# Patient Record
Sex: Male | Born: 1970 | Hispanic: No | Marital: Married | Smoking: Former smoker
Health system: Southern US, Community
[De-identification: ages and names within clinical notes are randomized; demographics above are authoritative.]

## PROBLEM LIST (undated history)

## (undated) DIAGNOSIS — E669 Obesity, unspecified: Secondary | ICD-10-CM

## (undated) DIAGNOSIS — I517 Cardiomegaly: Secondary | ICD-10-CM

## (undated) DIAGNOSIS — I10 Essential (primary) hypertension: Secondary | ICD-10-CM

## (undated) HISTORY — PX: BACK SURGERY: SHX140

## (undated) HISTORY — DX: Cardiomegaly: I51.7

## (undated) HISTORY — DX: Essential (primary) hypertension: I10

## (undated) HISTORY — PX: SHOULDER SURGERY: SHX246

---

## 2012-03-08 DIAGNOSIS — M5137 Other intervertebral disc degeneration, lumbosacral region: Secondary | ICD-10-CM | POA: Diagnosis not present

## 2012-03-08 DIAGNOSIS — Z0000 Encounter for general adult medical examination without abnormal findings: Secondary | ICD-10-CM | POA: Diagnosis not present

## 2012-03-08 DIAGNOSIS — I10 Essential (primary) hypertension: Secondary | ICD-10-CM | POA: Diagnosis not present

## 2012-03-08 DIAGNOSIS — G4730 Sleep apnea, unspecified: Secondary | ICD-10-CM | POA: Diagnosis not present

## 2012-03-09 DIAGNOSIS — I10 Essential (primary) hypertension: Secondary | ICD-10-CM | POA: Diagnosis not present

## 2012-03-09 DIAGNOSIS — E782 Mixed hyperlipidemia: Secondary | ICD-10-CM | POA: Diagnosis not present

## 2012-04-30 DIAGNOSIS — M5137 Other intervertebral disc degeneration, lumbosacral region: Secondary | ICD-10-CM | POA: Diagnosis not present

## 2012-04-30 DIAGNOSIS — I10 Essential (primary) hypertension: Secondary | ICD-10-CM | POA: Diagnosis not present

## 2013-11-16 ENCOUNTER — Encounter (HOSPITAL_COMMUNITY): Payer: Self-pay | Admitting: Emergency Medicine

## 2013-11-16 ENCOUNTER — Emergency Department (HOSPITAL_COMMUNITY)
Admission: EM | Admit: 2013-11-16 | Discharge: 2013-11-17 | Disposition: A | Discharge status: Home / Self Care | Payer: Medicare Other | Attending: Emergency Medicine | Admitting: Emergency Medicine

## 2013-11-16 ENCOUNTER — Emergency Department (HOSPITAL_COMMUNITY): Payer: Medicare Other

## 2013-11-16 DIAGNOSIS — R61 Generalized hyperhidrosis: Secondary | ICD-10-CM | POA: Diagnosis not present

## 2013-11-16 DIAGNOSIS — E669 Obesity, unspecified: Secondary | ICD-10-CM | POA: Diagnosis not present

## 2013-11-16 DIAGNOSIS — Z87891 Personal history of nicotine dependence: Secondary | ICD-10-CM | POA: Insufficient documentation

## 2013-11-16 DIAGNOSIS — R072 Precordial pain: Secondary | ICD-10-CM | POA: Diagnosis not present

## 2013-11-16 DIAGNOSIS — R0602 Shortness of breath: Secondary | ICD-10-CM | POA: Diagnosis not present

## 2013-11-16 DIAGNOSIS — I10 Essential (primary) hypertension: Secondary | ICD-10-CM | POA: Insufficient documentation

## 2013-11-16 DIAGNOSIS — I509 Heart failure, unspecified: Secondary | ICD-10-CM | POA: Diagnosis not present

## 2013-11-16 DIAGNOSIS — R079 Chest pain, unspecified: Secondary | ICD-10-CM

## 2013-11-16 DIAGNOSIS — Z79899 Other long term (current) drug therapy: Secondary | ICD-10-CM | POA: Insufficient documentation

## 2013-11-16 DIAGNOSIS — R0789 Other chest pain: Secondary | ICD-10-CM | POA: Insufficient documentation

## 2013-11-16 HISTORY — DX: Obesity, unspecified: E66.9

## 2013-11-16 LAB — BASIC METABOLIC PANEL
Anion gap: 13 (ref 5–15)
BUN: 9 mg/dL (ref 6–23)
CALCIUM: 8.7 mg/dL (ref 8.4–10.5)
CO2: 24 meq/L (ref 19–32)
CREATININE: 1.06 mg/dL (ref 0.50–1.35)
Chloride: 102 mEq/L (ref 96–112)
GFR calc Af Amer: >90 mL/min (ref >90)
GFR calc non Af Amer: 86 mL/min — ABNORMAL LOW (ref >90)
GLUCOSE: 111 mg/dL — AB (ref 70–99)
Potassium: 3.5 mEq/L — ABNORMAL LOW (ref 3.7–5.3)
Sodium: 139 mEq/L (ref 137–147)

## 2013-11-16 LAB — CBC WITH DIFFERENTIAL/PLATELET
Basophils Absolute: 0 10*3/uL (ref 0.0–0.1)
Basophils Relative: 0 % (ref 0–1)
Eosinophils Absolute: 0.1 10*3/uL (ref 0.0–0.7)
Eosinophils Relative: 1 % (ref 0–5)
HEMATOCRIT: 41.6 % (ref 39.0–52.0)
Hemoglobin: 13.9 g/dL (ref 13.0–17.0)
LYMPHS ABS: 3 10*3/uL (ref 0.7–4.0)
Lymphocytes Relative: 35 % (ref 12–46)
MCH: 29.6 pg (ref 26.0–34.0)
MCHC: 33.4 g/dL (ref 30.0–36.0)
MCV: 88.7 fL (ref 78.0–100.0)
MONO ABS: 0.5 10*3/uL (ref 0.1–1.0)
MONOS PCT: 6 % (ref 3–12)
Neutro Abs: 5 10*3/uL (ref 1.7–7.7)
Neutrophils Relative %: 58 % (ref 43–77)
Platelets: 199 10*3/uL (ref 150–400)
RBC: 4.69 MIL/uL (ref 4.22–5.81)
RDW: 15.5 % (ref 11.5–15.5)
WBC: 8.6 10*3/uL (ref 4.0–10.5)

## 2013-11-16 LAB — PRO B NATRIURETIC PEPTIDE: Pro B Natriuretic peptide (BNP): 36.7 pg/mL (ref 0–125)

## 2013-11-16 LAB — I-STAT TROPONIN, ED: Troponin i, poc: 0.01 ng/mL (ref 0.00–0.08)

## 2013-11-16 LAB — TROPONIN I

## 2013-11-16 MED ORDER — KETOROLAC TROMETHAMINE 30 MG/ML IJ SOLN
30.0000 mg | Freq: Once | INTRAMUSCULAR | Status: AC
  Administered 2013-11-16: 30 mg via INTRAVENOUS
  Filled 2013-11-16: qty 1

## 2013-11-16 MED ORDER — MORPHINE SULFATE 4 MG/ML IJ SOLN
4.0000 mg | Freq: Once | INTRAMUSCULAR | Status: AC
  Administered 2013-11-16: 4 mg via INTRAVENOUS
  Filled 2013-11-16: qty 1

## 2013-11-16 MED ORDER — HYDROCODONE-ACETAMINOPHEN 5-325 MG PO TABS: 1.0000 | ORAL_TABLET | ORAL | Status: DC | PRN

## 2013-11-16 NOTE — ED Provider Notes (Signed)
CSN: [id number]     Arrival date & time 11/16/13  2133 History  This chart was scribed for Geoffery Lyonsouglas Landen Knoedler, MD by Modena JanskyAlbert Thayil, ED Scribe. This patient was seen in room APA05/APA05 and the patient's care was started at 10:19 PM.   Chief Complaint  Patient presents with  . Chest Pain   Patient is a 43 y.o. male presenting with chest pain. The history is provided by the patient and the spouse. No language interpreter was used.  Chest Pain Pain location:  Substernal area Pain quality: sharp   Pain radiates to:  Does not radiate Pain radiates to the back: no   Pain severity:  Moderate Onset quality:  Sudden Duration:  1 day Timing:  Constant Progression:  Unchanged Chronicity:  New Context: at rest   Relieved by:  None tried Worsened by:  Deep breathing Ineffective treatments:  None tried Associated symptoms: diaphoresis and shortness of breath   Associated symptoms: no nausea   Risk factors: no smoking    HPI Comments: Dustin Burke is a 43 y.o. male with a hx of heart palpitations and HTN who presents to the Emergency Department complaining of constant moderate substernal chest pain that started today. He reports that when the he had SOB, diaphoresis, and his forearm would start tingling when the chest pain started. He describes the pain as a sharp sensation. He states that breathing and palpation exacerbates the pain. He reports that before today the pain has been intermittent. He reports that he is a former smoker. He denies any nausea.   Past Medical History  Diagnosis Date  . CHF (congestive heart failure)   . Obesity   . Hypertension    Past Surgical History  Procedure Laterality Date  . Back surgery    . Shoulder surgery Right    History reviewed. No pertinent family history. History  Substance Use Topics  . Smoking status: Former Smoker    Types: Cigarettes  . Smokeless tobacco: Not on file  . Alcohol Use: Yes     Comment: rare    Review of Systems  Constitutional:  Positive for diaphoresis.  Respiratory: Positive for shortness of breath.   Cardiovascular: Positive for chest pain.  Gastrointestinal: Negative for nausea.  All other systems reviewed and are negative.   Allergies  Review of patient's allergies indicates no known allergies.  Home Medications   Prior to Admission medications   Medication Sig Start Date End Date Taking? Authorizing Provider  amLODipine (NORVASC) 5 MG tablet Take 5 mg by mouth daily.   Yes Historical Provider, MD  lisinopril (PRINIVIL,ZESTRIL) 20 MG tablet Take 20 mg by mouth daily.   Yes Historical Provider, MD  naproxen sodium (ALEVE) 220 MG tablet Take 220 mg by mouth daily as needed (for pain).   Yes Historical Provider, MD   BP 164/106  Pulse 73  Temp(Src) 98.2 F (36.8 C) (Oral)  Resp 18  Ht 6\' 3"  (1.905 m)  Wt 272 lb (123.378 kg)  BMI 34.00 kg/m2  SpO2 99% Physical Exam  Nursing note and vitals reviewed. Constitutional: He is oriented to person, place, and time. He appears well-developed and well-nourished.  HENT:  Head: Normocephalic and atraumatic.  Neck: Neck supple. No tracheal deviation present.  Cardiovascular: Normal rate.   Pulmonary/Chest: Effort normal. No respiratory distress. He exhibits tenderness.  TTP of the left anterior chest wall. This reproduces symptoms.  Musculoskeletal: Normal range of motion.  Neurological: He is alert and oriented to person, place, and time.  Skin: Skin is warm and dry. He is not diaphoretic.  Psychiatric: He has a normal mood and affect. His behavior is normal.    ED Course  Procedures (including critical care time) DIAGNOSTIC STUDIES: Oxygen Saturation is 99% on RA, normal by my interpretation.    COORDINATION OF CARE: 10:23 PM- Pt advised of plan for treatment which includes medication, radiology, and labs and pt agrees.  Labs Review Labs Reviewed  BASIC METABOLIC PANEL - Abnormal; Notable for the following:    Potassium 3.5 (*)    Glucose, Bld 111  (*)    GFR calc non Af Amer 86 (*)    All other components within normal limits  CBC WITH DIFFERENTIAL  TROPONIN I  PRO B NATRIURETIC PEPTIDE  I-STAT TROPOININ, ED    Imaging Review No results found.   Date: 11/16/2013  Rate: 79  Rhythm: normal sinus rhythm  QRS Axis: normal  Intervals: normal  ST/T Wave abnormalities: normal  Conduction Disutrbances:none  Narrative Interpretation:   Old EKG Reviewed: none available    MDM   Final diagnoses:  None    Patient with no prior cardiac history but risk factors of hypertension and obesity. He presents with complaints of sharp pain to the left anterior chest wall that started earlier this afternoon. Is reproducible with palpation and deep breathing, but is associated with nausea and shortness of breath.  Thus far, a cardiac workup is negative. His EKG is normal and initial troponin is negative. He was given Toradol with some relief followed by a dose of morphine. He will remain in the ER for a second troponin. If this is negative I feel as though he is appropriate for discharge with outpatient followup. The nature of these symptoms appear musculoskeletal in nature and I doubt a cardiac etiology. As he does have the above risk factors, I feel as though followup with cardiology to discuss a stress test would be appropriate. The contact information for [facility] in [place] will be provided for him to arrange followup.  I personally performed the services described in this documentation, which was scribed in my presence. The recorded information has been reviewed and is accurate.       Geoffery Lyonsouglas Baylen Dea, MD 11/16/13 [phone]

## 2013-11-16 NOTE — ED Notes (Signed)
Patient had L sided sharp chest pain that went into a tightness last night at work.  Eventually went away.  Returned today at noon, resolving, then again upon returning home this evening.  Associated L lower arm tingling, some lightheadedness and SOB.  Had similar episode in 2011 and was dx w/CHF.

## 2013-11-16 NOTE — Discharge Instructions (Signed)
Ibuprofen 600 mg 3 times daily for the next 3 days. Hydrocodone as prescribed as needed for pain not relieved with ibuprofen.  Followup with [facility]ology on Monday to arrange a followup appointment to discuss a possible stress test. The contact information has been provided in this discharge summary.  Return to the emergency department if your symptoms substantially worsen or change.   Chest Pain (Nonspecific) It is often hard to give a specific diagnosis for the cause of chest pain. There is always a chance that your pain could be related to something serious, such as a heart attack or a blood clot in the lungs. You need to follow up with your health care provider for further evaluation. CAUSES   Heartburn.  Pneumonia or bronchitis.  Anxiety or stress.  Inflammation around your heart (pericarditis) or lung (pleuritis or pleurisy).  A blood clot in the lung.  A collapsed lung (pneumothorax). It can develop suddenly on its own (spontaneous pneumothorax) or from trauma to the chest.  Shingles infection (herpes zoster virus). The chest wall is composed of bones, muscles, and cartilage. Any of these can be the source of the pain.  The bones can be bruised by injury.  The muscles or cartilage can be strained by coughing or overwork.  The cartilage can be affected by inflammation and become sore (costochondritis). DIAGNOSIS  Lab tests or other studies may be needed to find the cause of your pain. Your health care provider may have you take a test called an ambulatory electrocardiogram (ECG). An ECG records your heartbeat patterns over a 24-hour period. You may also have other tests, such as:  Transthoracic echocardiogram (TTE). During echocardiography, sound waves are used to evaluate how blood flows through your heart.  Transesophageal echocardiogram (TEE).  Cardiac monitoring. This allows your health care provider to monitor your heart rate and rhythm in real time.  Holter  monitor. This is a portable device that records your heartbeat and can help diagnose heart arrhythmias. It allows your health care provider to track your heart activity for several days, if needed.  Stress tests by exercise or by giving medicine that makes the heart beat faster. TREATMENT   Treatment depends on what may be causing your chest pain. Treatment may include:  Acid blockers for heartburn.  Anti-inflammatory medicine.  Pain medicine for inflammatory conditions.  Antibiotics if an infection is present.  You may be advised to change lifestyle habits. This includes stopping smoking and avoiding alcohol, caffeine, and chocolate.  You may be advised to keep your head raised (elevated) when sleeping. This reduces the chance of acid going backward from your stomach into your esophagus. Most of the time, nonspecific chest pain will improve within 2-3 days with rest and mild pain medicine.  HOME CARE INSTRUCTIONS   If antibiotics were prescribed, take them as directed. Finish them even if you start to feel better.  For the next few days, avoid physical activities that bring on chest pain. Continue physical activities as directed.  Do not use any tobacco products, including cigarettes, chewing tobacco, or electronic cigarettes.  Avoid drinking alcohol.  Only take medicine as directed by your health care provider.  Follow your health care provider's suggestions for further testing if your chest pain does not go away.  Keep any follow-up appointments you made. If you do not go to an appointment, you could develop lasting (chronic) problems with pain. If there is any problem keeping an appointment, call to reschedule. SEEK MEDICAL CARE IF:  Your chest pain does not go away, even after treatment.  You have a rash with blisters on your chest.  You have a fever. SEEK IMMEDIATE MEDICAL CARE IF:   You have increased chest pain or pain that spreads to your arm, neck, jaw, back, or  abdomen.  You have shortness of breath.  You have an increasing cough, or you cough up blood.  You have severe back or abdominal pain.  You feel nauseous or vomit.  You have severe weakness.  You faint.  You have chills. This is an emergency. Do not wait to see if the pain will go away. Get medical help at once. Call your local emergency services (911 in [place] Do not drive yourself to the hospital. MAKE SURE YOU:   Understand these instructions.  Will watch your condition.  Will get help right away if you are not doing well or get worse. Document Released: 11/03/2004 Document Revised: 01/29/2013 Document Reviewed: 08/30/2007 [facility] Patient Information 2015 [employer] [place]. This information is not intended to replace advice given to you by your health care provider. Make sure you discuss any questions you have with your health care provider.

## 2013-11-17 LAB — TROPONIN I

## 2013-11-17 MED ORDER — LISINOPRIL 20 MG PO TABS
20.0000 mg | ORAL_TABLET | Freq: Every day | ORAL | Status: AC
Start: 2013-11-17 — End: ?

## 2013-11-17 MED ORDER — AMLODIPINE BESYLATE 5 MG PO TABS: 5.0000 mg | ORAL_TABLET | Freq: Every day | ORAL | Status: AC

## 2013-11-27 DIAGNOSIS — I10 Essential (primary) hypertension: Secondary | ICD-10-CM | POA: Diagnosis not present

## 2013-11-27 DIAGNOSIS — R079 Chest pain, unspecified: Secondary | ICD-10-CM | POA: Diagnosis not present

## 2013-12-03 ENCOUNTER — Encounter: Payer: Self-pay | Admitting: Cardiology

## 2013-12-03 ENCOUNTER — Ambulatory Visit (INDEPENDENT_AMBULATORY_CARE_PROVIDER_SITE_OTHER): Payer: Medicare Other | Admitting: Cardiology

## 2013-12-03 VITALS — BP 132/98 | HR 65 | Ht 75.0 in | Wt 299.0 lb

## 2013-12-03 DIAGNOSIS — I10 Essential (primary) hypertension: Secondary | ICD-10-CM

## 2013-12-03 DIAGNOSIS — R0789 Other chest pain: Secondary | ICD-10-CM

## 2013-12-03 NOTE — Progress Notes (Signed)
Reason for visit: Chest pain, history of hypertension and "enlarged heart"  Clinical Summary Dustin Burke is a 43 y.o.male referred for cardiology consultation by Dr. Reuel Boomaniel (he just established with [facility]). Recent ER visit noted at [facility] with atypical chest pain. Troponin I levels were negative for ACS and ECG showed sinus rhythm with no significant ST segment changes.  He is here with his wife today. Native of [place], moved from [place] to [place] recently, retired Agricultural consultantlong-distance truck driver. He states that he suddenly developed a sharp, focal, left-sided chest discomfort of moderate intensity at rest about a week before he went to the ER. There was no obvious precipitant for this. He states he had some relief with morphine, subsequently felt much better after using an anti-inflammatory prescribed at his visit with [facility]. No clear exertional exacerbation, no associated shortness of breath, felt mildly diaphoretic.  Chest x-ray from October 10 reports no active cardiopulmonary disease. Additional lab work found hemoglobin 13.9, platelets 199, potassium 3.5, BUN 9, creatinine 1.0, pro-BNP 36.7.  At baseline he has NYHA class II dyspnea, no palpitations or syncope. No claudication. Reports chronic back pain and prior back surgery.  No Known Allergies  Current Outpatient Prescriptions  Medication Sig Dispense Refill  . amLODipine (NORVASC) 5 MG tablet Take 1 tablet (5 mg total) by mouth daily.  15 tablet  0  . HYDROcodone-acetaminophen (NORCO) 5-325 MG per tablet Take 1-2 tablets by mouth every 4 (four) hours as needed.  12 tablet  0  . lisinopril (PRINIVIL) 20 MG tablet Take 1 tablet (20 mg total) by mouth daily.  15 tablet  0  . naproxen sodium (ALEVE) 220 MG tablet Take 220 mg by mouth daily as needed (for pain).       No current facility-administered medications for this visit.    Past Medical History  Diagnosis Date  . Obesity   . Essential hypertension   . Enlarged  heart     Details not clear     Past Surgical History  Procedure Laterality Date  . Back surgery    . Shoulder surgery Right     History reviewed. No pertinent family history.  Social History Dustin Burke reports that he has quit smoking. His smoking use included Cigarettes. He smoked 0.00 packs per day. He has never used smokeless tobacco. Dustin Burke reports that he drinks alcohol.  Review of Systems Complete review of systems negative except as otherwise outlined in the clinical summary and also the following. No fevers or chills. No cough or hemoptysis, stable appetite.  Physical Examination Filed Vitals:   12/03/13 0908  BP: 132/98  Pulse: 65   Filed Weights   12/03/13 0908  Weight: 299 lb (135.626 kg)   Obese male, appears comfortable at rest. HEENT: Conjunctiva and lids normal, oropharynx clear. Neck: Supple, no elevated JVP or carotid bruits, no thyromegaly. Lungs: Clear to auscultation, nonlabored breathing at rest. Cardiac: Regular rate and rhythm, no S3 or significant systolic murmur, no pericardial rub. Abdomen: Soft, nontender, bowel sounds present, no guarding or rebound. Extremities: No pitting edema, distal pulses 2+. Skin: Warm and dry. Musculoskeletal: No kyphosis. Neuropsychiatric: Alert and oriented x3, affect grossly appropriate.   Problem List and Plan   Atypical chest pain Symptoms atypical for ischemic etiology. 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 Baseline ECG normal. We will obtain a GXT for assessment.  Essential hypertension Reports compliance with Norvasc and lisinopril. States  that he was told that he had an enlarged heart following an echocardiogram done in [place] back in 2011, details not clear. Suspect that this meant he had LVH, although cannot exclude actual cardiomegaly or cardiomyopathy. We will obtain an echocardiogram as well to clarify.    Jonelle SidleSamuel G.  Emilyrose Darrah, M.D., F.A.C.C.

## 2013-12-03 NOTE — Assessment & Plan Note (Signed)
Symptoms atypical for ischemic etiology. 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 Baseline ECG normal. We will obtain a GXT for assessment.

## 2013-12-03 NOTE — Patient Instructions (Signed)
Your physician recommends that you schedule a follow-up appointment in: to be determined after tests.    Your physician recommends that you continue on your current medications as directed. Please refer to the Current Medication list given to you today.    Your physician has requested that you have an echocardiogram. Echocardiography is a painless test that uses sound waves to create images of your heart. It provides your doctor with information about the size and shape of your heart and how well your heart's chambers and valves are working. This procedure takes approximately one hour. There are no restrictions for this procedure.     Your physician has requested that you have an exercise tolerance test. For further information please visit [email or web address]. Please also follow instruction sheet, as given.        Thank you for choosing [facility] !         

## 2013-12-03 NOTE — Assessment & Plan Note (Signed)
Reports compliance with Norvasc and lisinopril. States that he was told that he had an enlarged heart following an echocardiogram done in [place] back in 2011, details not clear. Suspect that this meant he had LVH, although cannot exclude actual cardiomegaly or cardiomyopathy. We will obtain an echocardiogram as well to clarify.

## 2013-12-06 ENCOUNTER — Ambulatory Visit (HOSPITAL_COMMUNITY)
Admission: RE | Admit: 2013-12-06 | Discharge: 2013-12-06 | Disposition: A | Discharge status: Home / Self Care | Payer: Medicare Other | Source: Ambulatory Visit | Attending: Cardiology | Admitting: Cardiology

## 2013-12-06 ENCOUNTER — Encounter (HOSPITAL_COMMUNITY): Payer: Self-pay

## 2013-12-06 DIAGNOSIS — I10 Essential (primary) hypertension: Secondary | ICD-10-CM | POA: Insufficient documentation

## 2013-12-06 DIAGNOSIS — Z6837 Body mass index (BMI) 37.0-37.9, adult: Secondary | ICD-10-CM | POA: Diagnosis not present

## 2013-12-06 DIAGNOSIS — Z87891 Personal history of nicotine dependence: Secondary | ICD-10-CM | POA: Diagnosis not present

## 2013-12-06 DIAGNOSIS — E669 Obesity, unspecified: Secondary | ICD-10-CM | POA: Insufficient documentation

## 2013-12-06 DIAGNOSIS — I517 Cardiomegaly: Secondary | ICD-10-CM | POA: Insufficient documentation

## 2013-12-06 DIAGNOSIS — R079 Chest pain, unspecified: Secondary | ICD-10-CM | POA: Insufficient documentation

## 2013-12-06 DIAGNOSIS — I708 Atherosclerosis of other arteries: Secondary | ICD-10-CM | POA: Diagnosis not present

## 2013-12-06 DIAGNOSIS — I379 Nonrheumatic pulmonary valve disorder, unspecified: Secondary | ICD-10-CM | POA: Diagnosis not present

## 2013-12-06 DIAGNOSIS — R0789 Other chest pain: Secondary | ICD-10-CM

## 2013-12-06 NOTE — Progress Notes (Signed)
  Echocardiogram 2D Echocardiogram has been performed.  Marta Bouie 12/06/2013, 9:07 AM

## 2013-12-06 NOTE — Progress Notes (Addendum)
Stress Lab Nurses Notes - [facility]  Tresea MallJohn Summa 12/06/2013 Reason for doing test: Chest Pain and HTN Type of test: Regular GTX Nurse performing test: Parke PoissonPhyllis Billingsly, RN Nuclear Medicine Tech: Not Applicable Echo Tech: Not Applicable MD performing test: Shontia Gillooly/K.Lyman BishopLawrence NP Family MD: Sharion Balloonaniel Test explained and consent signed: Yes.   IV started: No IV started Symptoms: SOB & Fatigue Treatment/Intervention: None Reason test stopped: fatigue After recovery IV was: NA Patient to return to Nuc. Med at : NA Patient discharged: Home Patient's Condition upon discharge was: stable Comments: During test peak BP 192/81 & HR 157 .  Recovery BP 134/92 & HR 83.  Symptoms resolved in recovery. Erskine SpeedBillingsley, Phyllis T  Patient exercised according to the Bruce protocol for 9 min 14 sec, achieving 10.80 METs. Resting HR increased from 58 bpm up to 157 bpm representing 87% of THR, and resting bp increased from 137/98 up to 192/81. The test was stopped due to fatigue, the patient did not experience any chest pain.  Baseline EKG shows sinus bradycardia. Stress EKG shows non-specific upsloping ST depressions in the inferior leads and no significant arrhythmias  Conclusions. 1. Negative Stress EKG for ischemia 2. Duke treadmill score of 9, consistent with low risk for major cardiac events 3. Average exercise tolerance (100% of predicted based on age and gender)   Dominga FerryJ Ajene Carchi MD

## 2013-12-09 ENCOUNTER — Telehealth: Payer: Self-pay

## 2013-12-09 NOTE — Telephone Encounter (Signed)
I reviewed the GXT report. Please let patient know that it was normal, overall low risk study from a cardiac perspective.       ----- Message -----    From: Antoine PocheJonathan F Branch, MD    Sent: 12/06/2013  4:49 PM     To: Jonelle SidleSamuel G McDowell, MD   Subject: Inpatient Notes                        Spoke with pt,results given,copy to pcp

## 2014-05-19 DIAGNOSIS — I10 Essential (primary) hypertension: Secondary | ICD-10-CM | POA: Diagnosis not present

## 2014-11-22 DIAGNOSIS — M5442 Lumbago with sciatica, left side: Secondary | ICD-10-CM | POA: Diagnosis not present

## 2014-11-22 DIAGNOSIS — L2089 Other atopic dermatitis: Secondary | ICD-10-CM | POA: Diagnosis not present

## 2014-11-22 DIAGNOSIS — I10 Essential (primary) hypertension: Secondary | ICD-10-CM | POA: Diagnosis not present

## 2016-01-04 ENCOUNTER — Emergency Department (HOSPITAL_COMMUNITY)
Admission: EM | Admit: 2016-01-04 | Discharge: 2016-01-04 | Disposition: A | Discharge status: Home / Self Care | Payer: Commercial Managed Care - HMO | Attending: Emergency Medicine | Admitting: Emergency Medicine

## 2016-01-04 ENCOUNTER — Emergency Department (HOSPITAL_COMMUNITY): Payer: Commercial Managed Care - HMO

## 2016-01-04 ENCOUNTER — Encounter (HOSPITAL_COMMUNITY): Payer: Self-pay | Admitting: Emergency Medicine

## 2016-01-04 DIAGNOSIS — R079 Chest pain, unspecified: Secondary | ICD-10-CM | POA: Diagnosis not present

## 2016-01-04 DIAGNOSIS — Z7982 Long term (current) use of aspirin: Secondary | ICD-10-CM | POA: Diagnosis not present

## 2016-01-04 DIAGNOSIS — I10 Essential (primary) hypertension: Secondary | ICD-10-CM | POA: Insufficient documentation

## 2016-01-04 DIAGNOSIS — M549 Dorsalgia, unspecified: Secondary | ICD-10-CM | POA: Insufficient documentation

## 2016-01-04 DIAGNOSIS — Z79899 Other long term (current) drug therapy: Secondary | ICD-10-CM | POA: Insufficient documentation

## 2016-01-04 DIAGNOSIS — R0602 Shortness of breath: Secondary | ICD-10-CM | POA: Diagnosis not present

## 2016-01-04 DIAGNOSIS — R61 Generalized hyperhidrosis: Secondary | ICD-10-CM | POA: Diagnosis not present

## 2016-01-04 DIAGNOSIS — Z87891 Personal history of nicotine dependence: Secondary | ICD-10-CM | POA: Diagnosis not present

## 2016-01-04 LAB — BASIC METABOLIC PANEL
ANION GAP: 4 — AB (ref 5–15)
BUN: 16 mg/dL (ref 6–20)
CHLORIDE: 109 mmol/L (ref 101–111)
CO2: 25 mmol/L (ref 22–32)
Calcium: 8.5 mg/dL — ABNORMAL LOW (ref 8.9–10.3)
Creatinine, Ser: 0.93 mg/dL (ref 0.61–1.24)
GFR calc Af Amer: >60 mL/min (ref >60)
GLUCOSE: 121 mg/dL — AB (ref 65–99)
POTASSIUM: 3.7 mmol/L (ref 3.5–5.1)
Sodium: 138 mmol/L (ref 135–145)

## 2016-01-04 LAB — CBC
HEMATOCRIT: 42.1 % (ref 39.0–52.0)
HEMOGLOBIN: 14.2 g/dL (ref 13.0–17.0)
MCH: 30.7 pg (ref 26.0–34.0)
MCHC: 33.7 g/dL (ref 30.0–36.0)
MCV: 91.1 fL (ref 78.0–100.0)
Platelets: 154 10*3/uL (ref 150–400)
RBC: 4.62 MIL/uL (ref 4.22–5.81)
RDW: 14.5 % (ref 11.5–15.5)
WBC: 7.5 10*3/uL (ref 4.0–10.5)

## 2016-01-04 LAB — I-STAT TROPONIN, ED
TROPONIN I, POC: 0.01 ng/mL (ref 0.00–0.08)
Troponin i, poc: 0.01 ng/mL (ref 0.00–0.08)

## 2016-01-04 MED ORDER — FENTANYL CITRATE (PF) 100 MCG/2ML IJ SOLN
50.0000 ug | Freq: Once | INTRAMUSCULAR | Status: AC
  Administered 2016-01-04: 50 ug via INTRAVENOUS
  Filled 2016-01-04: qty 2

## 2016-01-04 MED ORDER — ASPIRIN 81 MG PO CHEW
324.0000 mg | CHEWABLE_TABLET | Freq: Once | ORAL | Status: AC
  Administered 2016-01-04: 324 mg via ORAL
  Filled 2016-01-04: qty 4

## 2016-01-04 MED ORDER — HYDROCODONE-ACETAMINOPHEN 5-325 MG PO TABS: 1.0000 | ORAL_TABLET | Freq: Four times a day (QID) | ORAL | 0 refills | Status: DC | PRN

## 2016-01-04 MED ORDER — ASPIRIN 81 MG PO CHEW: 81.0000 mg | CHEWABLE_TABLET | Freq: Every day | ORAL | 1 refills | Status: AC

## 2016-01-04 NOTE — ED Triage Notes (Signed)
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 Pt states the pain has been constant since it started. Pt denies n/v.

## 2016-01-04 NOTE — Discharge Instructions (Signed)
Workup for the chest pain without any acute findings. Start taking a baby aspirin a day. Take hydrocodone as needed for the pain. Work note provided. Make an appointment to follow-up with cardiology. Return for any new or worse symptoms at all.

## 2016-01-04 NOTE — ED Provider Notes (Signed)
[facility]Y DEPT Provider Note   CSN: [id number] Arrival date & time: 01/04/16  1254  By signing my name below, I, Dustin Burke, attest that this documentation has been prepared under the direction and in the presence of Vanetta MuldersScott Darrel Gloss, MD . Electronically Signed: Majel HomerPeyton Burke, Scribe. 01/04/2016. 1:26 PM.  History   Chief Complaint Chief Complaint  Patient presents with  . Chest Pain   The history is provided by the patient. No language interpreter was used.   HPI Comments: Dustin Burke is a 45 y.o. male with PMHx of HTN, who presents to the Emergency Department complaining of gradually worsening, 8/10, left sided chest pain radiating into his left arm that began 3 days ago. Pt reports his pain began as intermittent but has become constant since ~11:30 PM last night. He notes associated shortness of breath and diaphoresis. He states he took 81 mg Bayer at ~8:30 PM last night with no relief; he notes he has not taken any medication today. Pt denies nausea, vomiting, PSHx of stents or cardiac catheterization and hx of smoking. He denies FHx of MI or heart problems.   Past Medical History:  Diagnosis Date  . Enlarged heart    Details not clear   . Essential hypertension   . Obesity     Patient Active Problem List   Diagnosis Date Noted  . Atypical chest pain 12/03/2013  . Essential hypertension 12/03/2013    Past Surgical History:  Procedure Laterality Date  . BACK SURGERY    . SHOULDER SURGERY Right     Home Medications    Prior to Admission medications   Medication Sig Start Date End Date Taking? Authorizing Provider  amLODipine (NORVASC) 5 MG tablet Take 1 tablet (5 mg total) by mouth daily. 11/17/13   Derwood KaplanAnkit Nanavati, MD  HYDROcodone-acetaminophen (NORCO) 5-325 MG per tablet Take 1-2 tablets by mouth every 4 (four) hours as needed. 11/16/13   Geoffery Lyonsouglas Delo, MD  lisinopril (PRINIVIL) 20 MG tablet Take 1 tablet (20 mg total) by mouth daily. 11/17/13   Derwood KaplanAnkit Nanavati, MD    naproxen sodium (ALEVE) 220 MG tablet Take 220 mg by mouth daily as needed (for pain).    Historical Provider, MD    Family History Family History  Problem Relation Age of Onset  . Hypertension Mother   . Asthma Mother   . Diabetes Father   . Stroke Other     Social History Social History  Substance Use Topics  . Smoking status: Former Smoker    Types: Cigarettes  . Smokeless tobacco: Never Used  . Alcohol use Yes     Comment: Rare     Allergies   Patient has no known allergies.   Review of Systems Review of Systems  Constitutional: Positive for diaphoresis. Negative for chills and fever.  HENT: Negative for rhinorrhea and sore throat.   Eyes: Negative for visual disturbance.  Respiratory: Positive for shortness of breath. Negative for cough.   Cardiovascular: Positive for chest pain.  Gastrointestinal: Negative for abdominal pain, diarrhea, nausea and vomiting.  Genitourinary: Negative for dysuria and hematuria.  Musculoskeletal: Positive for back pain. Negative for joint swelling.  Skin: Negative for rash.  Neurological: Negative for headaches.  Hematological: Does not bruise/bleed easily.   Physical Exam Updated Vital Signs BP 152/88 (BP Location: Left Arm)   Pulse 61   Temp 98.5 F (36.9 C) (Oral)   Resp 16   Ht 6\' 3"  (1.905 m)   Wt 272 lb (123.4 kg)  SpO2 97%   BMI 34.00 kg/m   Physical Exam  Constitutional: He is oriented to person, place, and time.  HENT:  Head: Normocephalic and atraumatic.  Mouth/Throat: Oropharynx is clear and moist.  Moist mucous membranes   Eyes: EOM are normal. Pupils are equal, round, and reactive to light. No scleral icterus.  Eyes are tracking normally, sclera clear.   Cardiovascular: Normal rate, regular rhythm and normal heart sounds.   Pulmonary/Chest: Effort normal and breath sounds normal.  Lungs clear to auscultation bilaterally   Abdominal: Bowel sounds are normal. There is no tenderness.  Musculoskeletal: He  exhibits no edema, tenderness or deformity.  No swelling in ankles   Neurological: He is alert and oriented to person, place, and time. Coordination normal.  Skin: No rash noted. No erythema. No pallor.   ED Treatments / Results  Labs (all labs ordered are listed, but only abnormal results are displayed) Labs Reviewed - No data to display  EKG  EKG Interpretation  Date/Time:  Monday January 04 2016 12:58:52 EST Ventricular Rate:  63 PR Interval:  174 QRS Duration: 96 QT Interval:  374 QTC Calculation: 382 R Axis:   32 Text Interpretation:  Normal sinus rhythm Normal ECG Confirmed by Alta Goding  MD, Ondine Gemme ([id number]) on 01/04/2016 1:10:53 PM       Radiology No results found.  Procedures Procedures (including critical care time)  Medications Ordered in ED Medications - No data to display  DIAGNOSTIC STUDIES:  Oxygen Saturation is 98% on RA, normal by my interpretation.    COORDINATION OF CARE:  1:25 PM Discussed treatment plan with pt at bedside and pt agreed to plan.  Initial Impression / Assessment and Plan / ED Course  I have reviewed the triage vital signs and the nursing notes.  Pertinent labs & imaging results that were available during my care of the patient were reviewed by me and considered in my medical decision making (see chart for details).  Clinical Course     Extensive workup for the chest pain including troponins 2 negative chest x-ray negative no hypoxia. No exact cause for the chest pain that's been there for several days and has been constant since last night. Will treat with baby aspirin a day hydrocodone follow-up with cardiology no evidence of pneumonia no evidence of pneumothorax. Cardiac markers negative. EKG without acute findings.  I personally performed the services described in this documentation, which was scribed in my presence. The recorded information has been reviewed and is accurate.     Final Clinical Impressions(s) / ED Diagnoses     Final diagnoses:  None    New Prescriptions New Prescriptions   No medications on file     Vanetta MuldersScott Diyari Cherne, MD 01/04/16 1740

## 2016-01-12 ENCOUNTER — Encounter: Payer: Self-pay | Admitting: Cardiology

## 2016-01-19 ENCOUNTER — Encounter: Payer: Self-pay | Admitting: Cardiology

## 2016-01-19 NOTE — Progress Notes (Signed)
Cardiology Office Note  Date: 01/20/2016   ID: Dustin MallJohn Burke, DOB Nov 17, 1970, MRN [id number]  PCP: Donzetta SprungERRY DANIEL, MD  Evaluating Cardiologist: Nona DellSamuel Gustav Knueppel, MD   Chief Complaint  Patient presents with  . Precordial pain    History of Present Illness: Dustin Burke is a 45 y.o. male referred back to the office after recent ER visit in late November. He was seen back in 2015 for evaluation of atypical chest pain. Previous cardiac testing is outlined below. He was evaluated by Dr. Deretha EmoryZackowski during ER visit, ECG did not show any acute changes and troponin I levels were negative.  He is here with his wife today. 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 He states that he was beginning to experience chest discomfort, aching sensation, worsened by activity at times but not exclusively. The wife states that he had been having this fairly regularly, he reports a more intense episode that led to the ER visit. He has been back at home and not driving and reports no further symptoms. Question brought up by patient and wife whether he might have been experiencing stress being away from home as a cause for symptoms. He has not had any exertional symptoms now with typical ADLs.  Past Medical History:  Diagnosis Date  . Essential hypertension   . Left ventricular hypertrophy   . Obesity     Past Surgical History:  Procedure Laterality Date  . BACK SURGERY    . SHOULDER SURGERY Right     Current Outpatient Prescriptions  Medication Sig Dispense Refill  . amLODipine (NORVASC) 5 MG tablet Take 1 tablet (5 mg total) by mouth daily. 15 tablet 0  . aspirin 81 MG chewable tablet Chew 1 tablet (81 mg total) by mouth daily. 30 tablet 1  . aspirin EC 81 MG tablet Take 81 mg by mouth daily.    Marland Kitchen. lisinopril (PRINIVIL) 20 MG tablet Take 1 tablet (20 mg total) by mouth daily. 15 tablet 0   No current facility-administered medications for this visit.    Allergies:   Hydrocodone   Social History: The patient  reports that he quit smoking about 5 years ago. His smoking use included Cigarettes. He has never used smokeless tobacco. He reports that he drinks alcohol. He reports that he does not use drugs.   Family History: The patient's family history includes Asthma in his mother; Diabetes in his father; Hypertension in his mother; Stroke in his other.   ROS:  Please see the history of present illness. Otherwise, complete review of systems is positive for intentional weight loss of 20 pounds.  All other systems are reviewed and negative.   Physical Exam: VS:  BP (!) 144/84   Pulse 79   Ht 6\' 3"  (1.905 m)   Wt 281 lb (127.5 kg)   SpO2 97%   BMI 35.12 kg/m , BMI Body mass index is 35.12 kg/m.  Wt Readings from Last 3 Encounters:  01/20/16 281 lb (127.5 kg)  01/04/16 272 lb (123.4 kg)  12/03/13 299 lb (135.6 kg)    General: Obese male, comfortable at rest. HEENT: Conjunctiva and lids normal, oropharynx clear. Neck: Supple, no elevated JVP or carotid bruits, no thyromegaly. Lungs: Clear to auscultation, nonlabored breathing at rest. Cardiac: Regular rate and rhythm, no S3 or significant systolic murmur, no pericardial rub. Abdomen: Soft, nontender, bowel sounds present, no guarding or rebound. Extremities: No pitting edema, distal pulses 2+. Skin: Warm and dry.  Musculoskeletal: No kyphosis. Neuropsychiatric: Alert and oriented x3, affect grossly appropriate.  ECG: I personally reviewed the tracing from 01/04/2016 which showed normal sinus rhythm.  Recent Labwork: 01/04/2016: BUN 16; Creatinine, Ser 0.93; Hemoglobin 14.2; Platelets 154; Potassium 3.7; Sodium 138   Other Studies Reviewed Today:  Echocardiogram 11/26/2013: Study Conclusions  - Left ventricle: The cavity size was normal. Wall thickness was increased in a pattern of moderate LVH. Systolic function was normal. The estimated ejection fraction was in the range of 60% to 65%.  Wall motion was normal; there were no regional wall motion abnormalities. Left ventricular diastolic function parameters were normal. - Aortic valve: Mildly calcified annulus. Mildly thickened leaflets. - Left atrium: The atrium was mildly dilated. - Atrial septum: No defect or patent foramen ovale was identified. - Technically adequate study.  GXT 12/06/2013: Maximal workload 10.8 METs, no chest pain, nonspecific ST segment changes overall negative for ischemia.  Chest x-ray 01/04/2016: FINDINGS: There is no edema or consolidation. The heart size and pulmonary vascularity are normal. No adenopathy. No bone lesions. No pneumothorax.  IMPRESSION: No edema or consolidation.  Assessment and Plan:  1. Precordial pain, not inconsistent with angina but some features are atypical. 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 He had a reassuring GXT back in 2015, no interval ischemic testing. Cardiac risk factors include gender, obesity, prior tobacco use, and hypertension. We'll proceed with a follow-up 2 day protocol exercise Myoview for reassessment.  2. Morbid obesity, agree with attempts at weight loss.  3. Essential hypertension, continues on lisinopril and Norvasc.  4. Tobacco abuse in remission.  Current medicines were reviewed with the patient today.   Orders Placed This Encounter  Procedures  . NM Myocar Multi W/Spect W/Wall Motion / EF    Disposition: Call with test results.  Signed, Jonelle SidleSamuel G. Wallis Spizzirri, MD, [facility] CenterFACC 01/20/2016 2:43 PM    [facility] [address], [place], [place] [address] Phone: [phone]; Fax: [phone]

## 2016-01-20 ENCOUNTER — Ambulatory Visit (INDEPENDENT_AMBULATORY_CARE_PROVIDER_SITE_OTHER): Payer: Commercial Managed Care - HMO | Admitting: Cardiology

## 2016-01-20 ENCOUNTER — Encounter: Payer: Self-pay | Admitting: Cardiology

## 2016-01-20 VITALS — BP 144/84 | HR 79 | Ht 75.0 in | Wt 281.0 lb

## 2016-01-20 DIAGNOSIS — F17201 Nicotine dependence, unspecified, in remission: Secondary | ICD-10-CM

## 2016-01-20 DIAGNOSIS — R072 Precordial pain: Secondary | ICD-10-CM | POA: Diagnosis not present

## 2016-01-20 DIAGNOSIS — I10 Essential (primary) hypertension: Secondary | ICD-10-CM

## 2016-01-20 NOTE — Patient Instructions (Signed)
Medication Instructions:  Your physician recommends that you continue on your current medications as directed. Please refer to the Current Medication list given to you today.    Labwork: NONE  Testing/Procedures: Your physician has requested that you have en exercise stress myoview. For further information please visit [email or web address]. Please follow instruction sheet, as given.( 2- DAY)    Follow-Up: Your physician recommends that you schedule a follow-up appointment in: TO BE DETERMINED    Any Other Special Instructions Will Be Listed Below (If Applicable).     If you need a refill on your cardiac medications before your next appointment, please call your pharmacy.

## 2016-01-25 ENCOUNTER — Encounter (HOSPITAL_COMMUNITY)
Admission: RE | Admit: 2016-01-25 | Discharge: 2016-01-25 | Disposition: A | Discharge status: Home / Self Care | Payer: Commercial Managed Care - HMO | Source: Ambulatory Visit | Attending: Cardiology | Admitting: Cardiology

## 2016-01-25 ENCOUNTER — Encounter (HOSPITAL_COMMUNITY): Payer: Commercial Managed Care - HMO

## 2016-01-25 ENCOUNTER — Inpatient Hospital Stay (HOSPITAL_COMMUNITY): Admission: RE | Admit: 2016-01-25 | Payer: Commercial Managed Care - HMO | Source: Ambulatory Visit

## 2016-01-25 ENCOUNTER — Encounter (HOSPITAL_COMMUNITY): Payer: Self-pay

## 2016-01-25 DIAGNOSIS — I10 Essential (primary) hypertension: Secondary | ICD-10-CM | POA: Insufficient documentation

## 2016-01-25 DIAGNOSIS — R072 Precordial pain: Secondary | ICD-10-CM | POA: Insufficient documentation

## 2016-01-25 MED ORDER — TECHNETIUM TC 99M TETROFOSMIN IV KIT
30.0000 | PACK | Freq: Once | INTRAVENOUS | Status: AC | PRN
  Administered 2016-01-25: 30 via INTRAVENOUS

## 2016-01-25 MED ORDER — SODIUM CHLORIDE 0.9% FLUSH
INTRAVENOUS | Status: AC
  Administered 2016-01-25: 10 mL via INTRAVENOUS
  Filled 2016-01-25: qty 10

## 2016-01-25 MED ORDER — REGADENOSON 0.4 MG/5ML IV SOLN
INTRAVENOUS | Status: AC
  Filled 2016-01-25: qty 5

## 2016-01-26 ENCOUNTER — Encounter (HOSPITAL_COMMUNITY)
Admission: RE | Admit: 2016-01-26 | Discharge: 2016-01-26 | Disposition: A | Discharge status: Home / Self Care | Payer: Commercial Managed Care - HMO | Source: Ambulatory Visit | Attending: Cardiology | Admitting: Cardiology

## 2016-01-26 ENCOUNTER — Encounter: Payer: Commercial Managed Care - HMO | Admitting: Cardiology

## 2016-01-26 DIAGNOSIS — R072 Precordial pain: Secondary | ICD-10-CM | POA: Diagnosis not present

## 2016-01-26 DIAGNOSIS — I10 Essential (primary) hypertension: Secondary | ICD-10-CM | POA: Diagnosis not present

## 2016-01-26 LAB — NM MYOCAR MULTI W/SPECT W/WALL MOTION / EF
CHL CUP MPHR: 177 {beats}/min
CHL CUP NUCLEAR SDS: 1
CHL CUP NUCLEAR SRS: 1
CHL CUP NUCLEAR SSS: 2
CSEPED: 10 min
CSEPHR: 90 %
CSEPPHR: 160 {beats}/min
Estimated workload: 11.9 METS
Exercise duration (sec): 0 s
LV sys vol: 47 mL
LVDIAVOL: 119 mL (ref 62–150)
RATE: 0.45
RPE: 12
Rest HR: 67 {beats}/min
TID: 0.92

## 2016-01-26 MED ORDER — TECHNETIUM TC 99M TETROFOSMIN IV KIT
25.0000 | PACK | Freq: Once | INTRAVENOUS | Status: AC | PRN
  Administered 2016-01-26: 27 via INTRAVENOUS

## 2016-03-22 DIAGNOSIS — Z0100 Encounter for examination of eyes and vision without abnormal findings: Secondary | ICD-10-CM | POA: Diagnosis not present

## 2016-10-19 DIAGNOSIS — M5442 Lumbago with sciatica, left side: Secondary | ICD-10-CM | POA: Diagnosis not present

## 2016-10-19 DIAGNOSIS — Z6836 Body mass index (BMI) 36.0-36.9, adult: Secondary | ICD-10-CM | POA: Diagnosis not present

## 2017-11-28 DIAGNOSIS — L2084 Intrinsic (allergic) eczema: Secondary | ICD-10-CM | POA: Diagnosis not present

## 2017-11-28 DIAGNOSIS — Z0001 Encounter for general adult medical examination with abnormal findings: Secondary | ICD-10-CM | POA: Diagnosis not present

## 2017-11-28 DIAGNOSIS — Z6836 Body mass index (BMI) 36.0-36.9, adult: Secondary | ICD-10-CM | POA: Diagnosis not present

## 2017-11-28 DIAGNOSIS — I10 Essential (primary) hypertension: Secondary | ICD-10-CM | POA: Diagnosis not present

## 2017-11-28 DIAGNOSIS — M5136 Other intervertebral disc degeneration, lumbar region: Secondary | ICD-10-CM | POA: Diagnosis not present

## 2017-11-28 DIAGNOSIS — G4733 Obstructive sleep apnea (adult) (pediatric): Secondary | ICD-10-CM | POA: Diagnosis not present

## 2017-11-28 DIAGNOSIS — Z125 Encounter for screening for malignant neoplasm of prostate: Secondary | ICD-10-CM | POA: Diagnosis not present

## 2018-11-21 IMAGING — DX DG CHEST 2V
2 series · 2 of 2 positions shown · non-contrast
Comparison: November 16, 2013

CLINICAL DATA: Chest pain and shortness of breath

EXAM:
CHEST  2 VIEW

[chest pa]
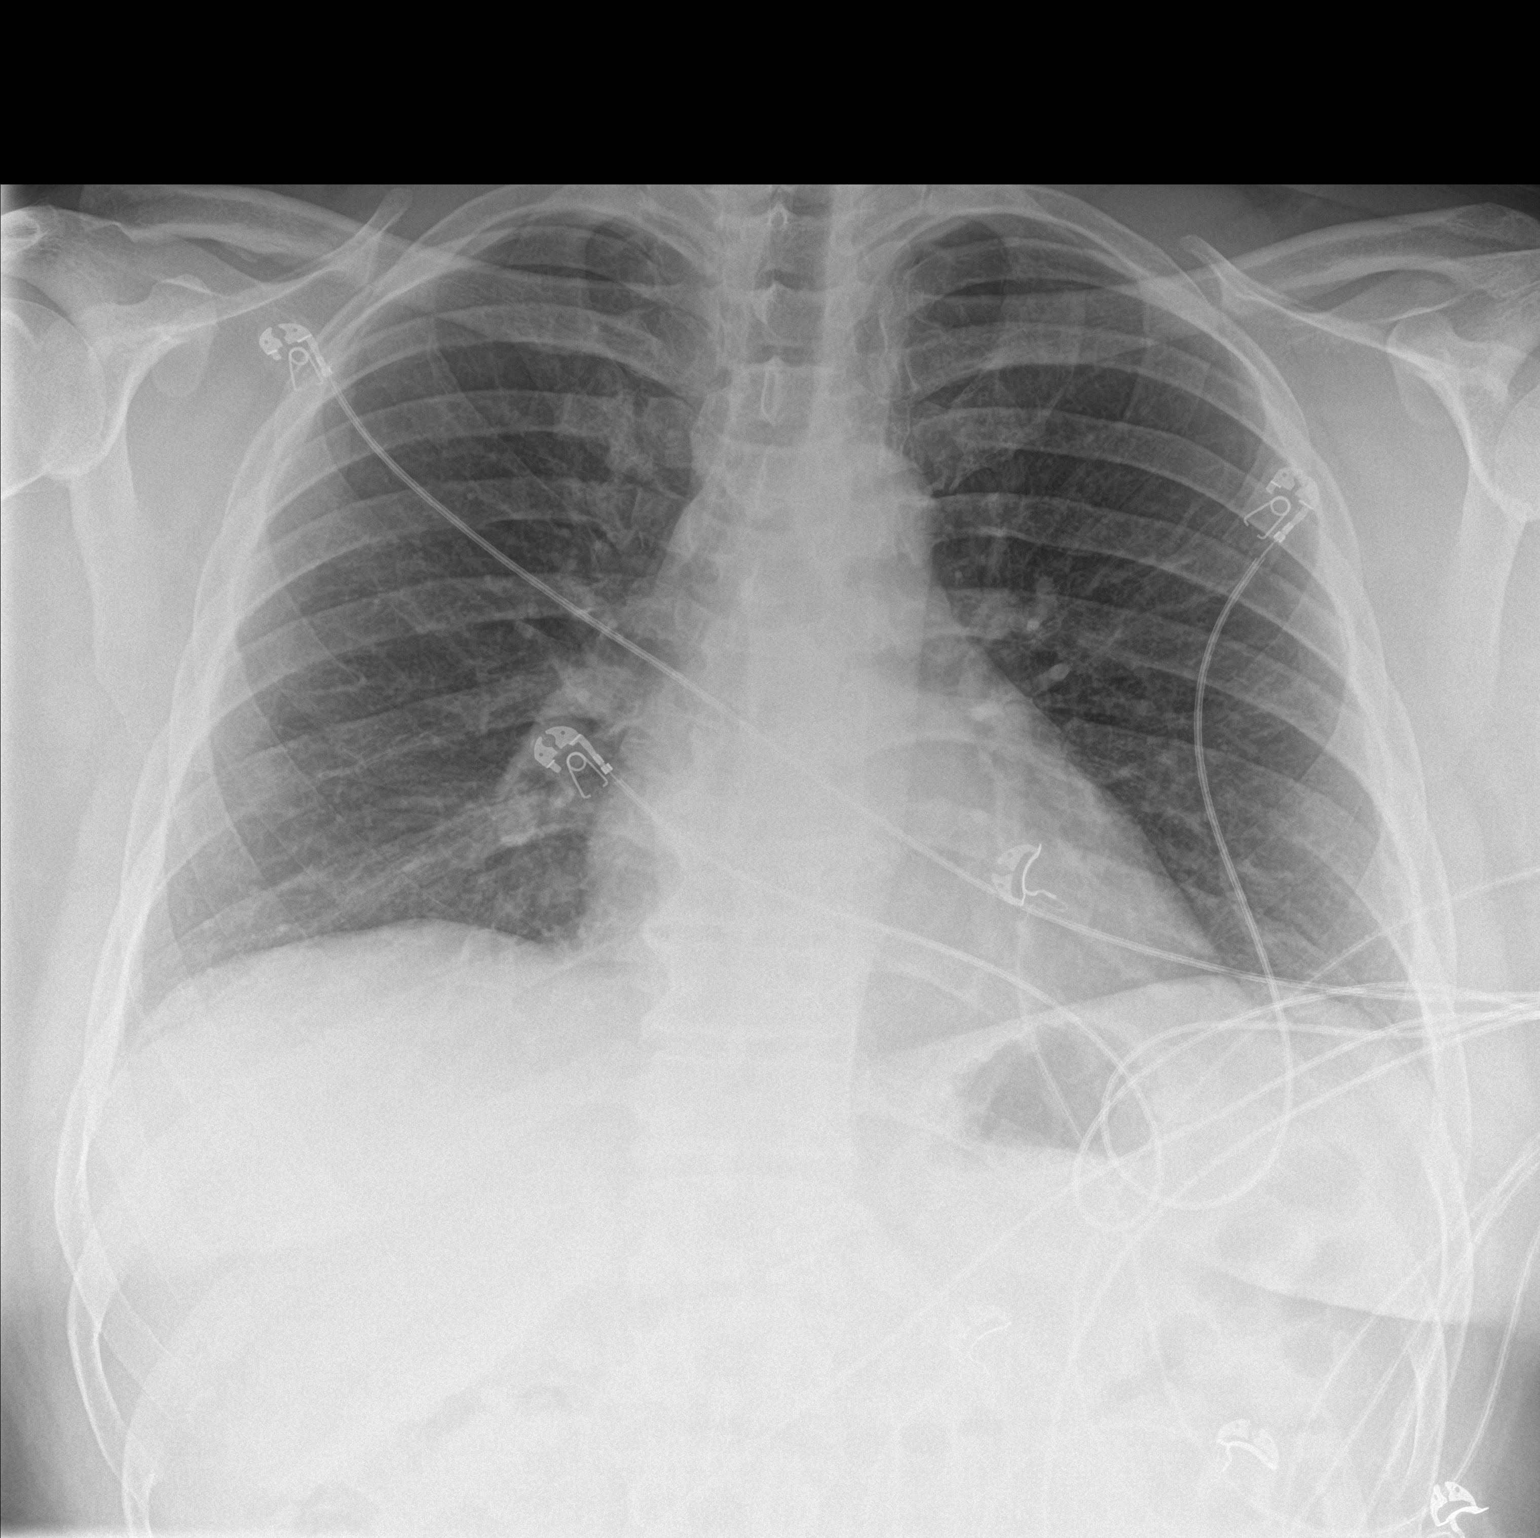

[chest lat]
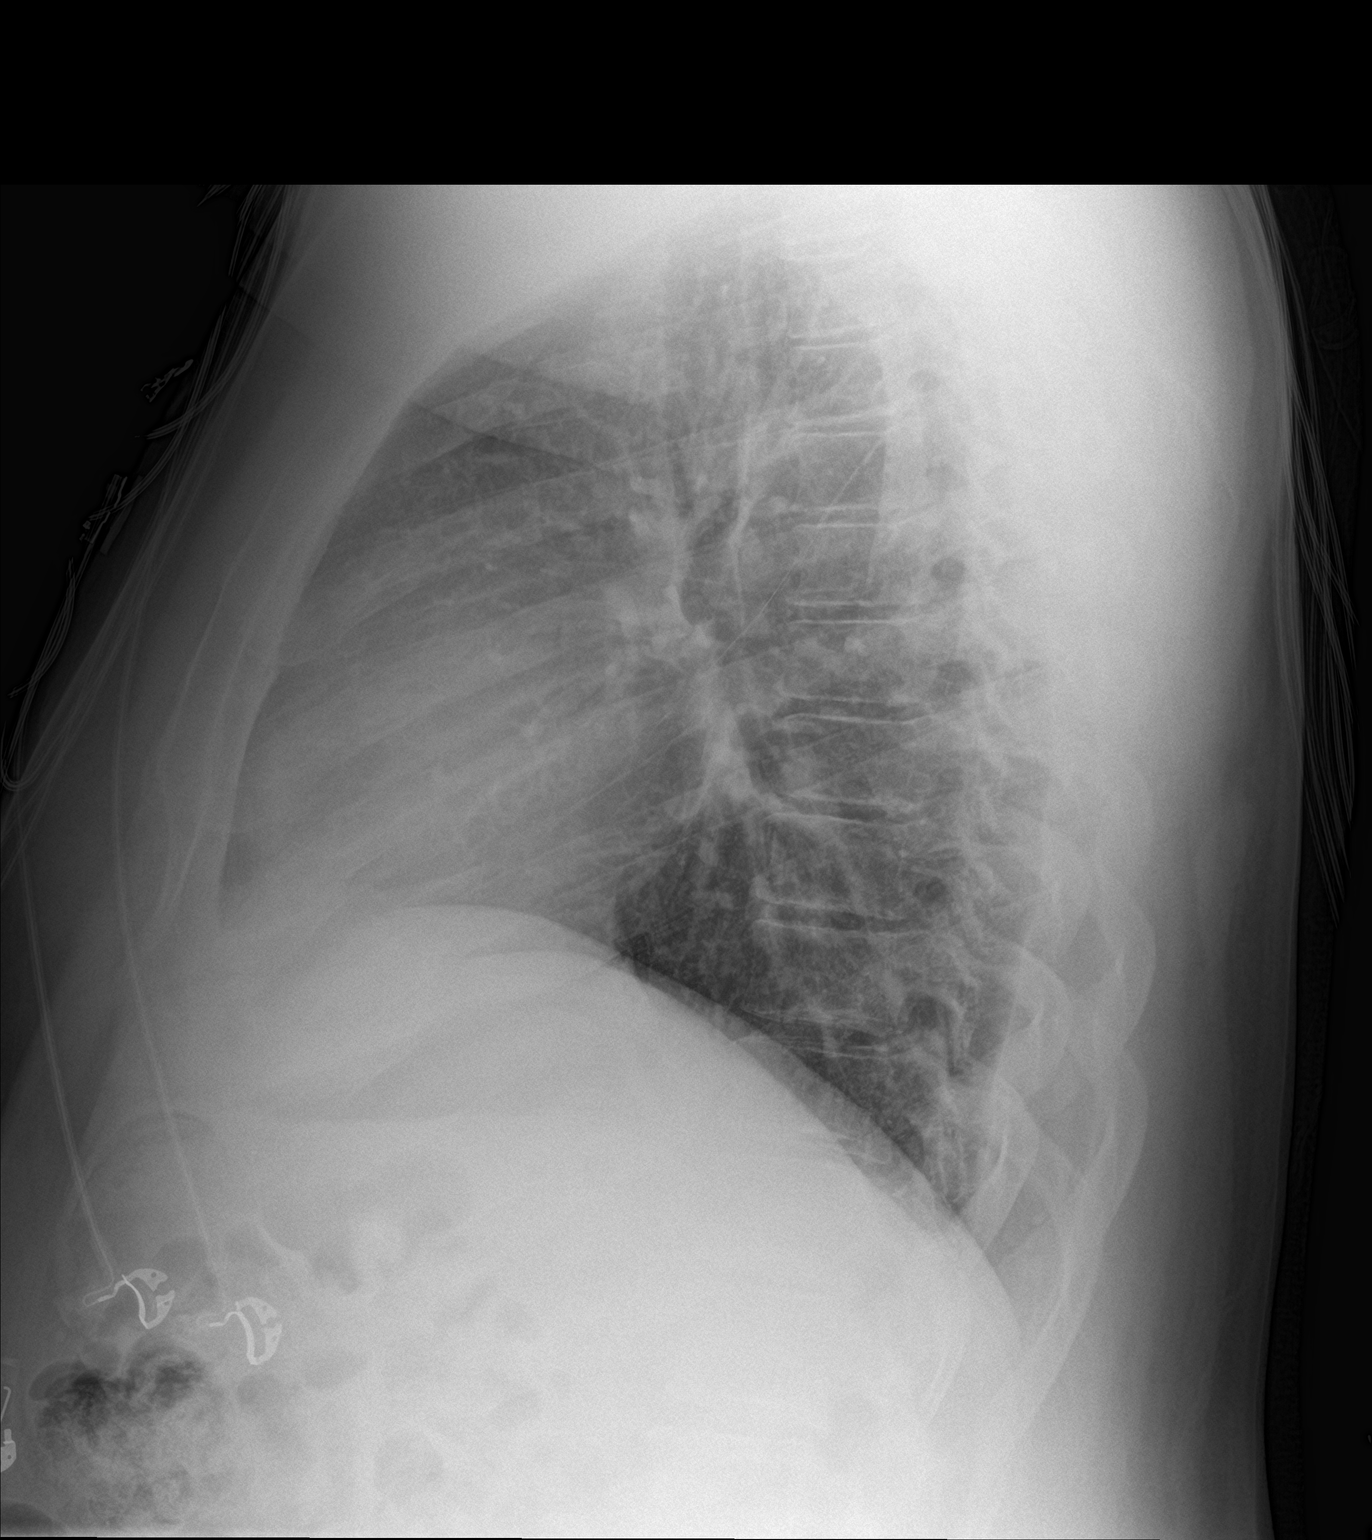

[2 of 2 positions shown; findings below may reference images not displayed]

FINDINGS: There is no edema or consolidation. The heart size and pulmonary
vascularity are normal. No adenopathy. No bone lesions. No
pneumothorax.
IMPRESSION: No edema or consolidation.

## 2018-11-30 DIAGNOSIS — L2084 Intrinsic (allergic) eczema: Secondary | ICD-10-CM | POA: Diagnosis not present

## 2018-11-30 DIAGNOSIS — M5136 Other intervertebral disc degeneration, lumbar region: Secondary | ICD-10-CM | POA: Diagnosis not present

## 2018-11-30 DIAGNOSIS — I10 Essential (primary) hypertension: Secondary | ICD-10-CM | POA: Diagnosis not present

## 2018-11-30 DIAGNOSIS — Z6836 Body mass index (BMI) 36.0-36.9, adult: Secondary | ICD-10-CM | POA: Diagnosis not present

## 2018-11-30 DIAGNOSIS — Z800 Family history of malignant neoplasm of digestive organs: Secondary | ICD-10-CM | POA: Diagnosis not present

## 2018-11-30 DIAGNOSIS — Z0001 Encounter for general adult medical examination with abnormal findings: Secondary | ICD-10-CM | POA: Diagnosis not present

## 2018-11-30 DIAGNOSIS — G4733 Obstructive sleep apnea (adult) (pediatric): Secondary | ICD-10-CM | POA: Diagnosis not present
